# Patient Record
Sex: Male | Born: 1985 | Race: Black or African American | Hispanic: No | Marital: Single | Smoking: Current every day smoker
Health system: Southern US, Community
[De-identification: ages and names within clinical notes are randomized; demographics above are authoritative.]

## PROBLEM LIST (undated history)

## (undated) DIAGNOSIS — F419 Anxiety disorder, unspecified: Secondary | ICD-10-CM

## (undated) HISTORY — PX: APPENDECTOMY: SHX54

---
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
## 2008-03-03 ENCOUNTER — Emergency Department (HOSPITAL_COMMUNITY): Admission: EM | Admit: 2008-03-03 | Discharge: 2008-03-03 | Payer: Self-pay | Admitting: Emergency Medicine

## 2008-08-30 ENCOUNTER — Emergency Department (HOSPITAL_COMMUNITY): Admission: EM | Admit: 2008-08-30 | Discharge: 2008-08-30 | Payer: Self-pay | Admitting: Emergency Medicine

## 2008-09-23 ENCOUNTER — Emergency Department (HOSPITAL_COMMUNITY): Admission: EM | Admit: 2008-09-23 | Discharge: 2008-09-23 | Payer: Self-pay | Admitting: Emergency Medicine

## 2016-07-13 ENCOUNTER — Emergency Department (HOSPITAL_COMMUNITY)
Admission: EM | Admit: 2016-07-13 | Discharge: 2016-07-14 | Disposition: A | Discharge status: Home / Self Care | Payer: Self-pay | Attending: Emergency Medicine | Admitting: Emergency Medicine

## 2016-07-13 ENCOUNTER — Emergency Department (HOSPITAL_COMMUNITY): Payer: Self-pay

## 2016-07-13 ENCOUNTER — Encounter (HOSPITAL_COMMUNITY): Payer: Self-pay | Admitting: Emergency Medicine

## 2016-07-13 DIAGNOSIS — M62838 Other muscle spasm: Secondary | ICD-10-CM

## 2016-07-13 DIAGNOSIS — R0789 Other chest pain: Secondary | ICD-10-CM

## 2016-07-13 DIAGNOSIS — F17200 Nicotine dependence, unspecified, uncomplicated: Secondary | ICD-10-CM | POA: Insufficient documentation

## 2016-07-13 DIAGNOSIS — Z720 Tobacco use: Secondary | ICD-10-CM

## 2016-07-13 DIAGNOSIS — N179 Acute kidney failure, unspecified: Secondary | ICD-10-CM

## 2016-07-13 HISTORY — DX: Anxiety disorder, unspecified: F41.9

## 2016-07-13 LAB — CBC
HEMATOCRIT: 48.4 % (ref 39.0–52.0)
Hemoglobin: 16.6 g/dL (ref 13.0–17.0)
MCH: 31.1 pg (ref 26.0–34.0)
MCHC: 34.3 g/dL (ref 30.0–36.0)
MCV: 90.8 fL (ref 78.0–100.0)
PLATELETS: 235 10*3/uL (ref 150–400)
RBC: 5.33 MIL/uL (ref 4.22–5.81)
RDW: 12.4 % (ref 11.5–15.5)
WBC: 10.2 10*3/uL (ref 4.0–10.5)

## 2016-07-13 LAB — BASIC METABOLIC PANEL
Anion gap: 5 (ref 5–15)
BUN: 14 mg/dL (ref 6–20)
CHLORIDE: 107 mmol/L (ref 101–111)
CO2: 29 mmol/L (ref 22–32)
CREATININE: 1.37 mg/dL — AB (ref 0.61–1.24)
Calcium: 9.2 mg/dL (ref 8.9–10.3)
GFR calc non Af Amer: >60 mL/min (ref >60)
Glucose, Bld: 86 mg/dL (ref 65–99)
POTASSIUM: 3.5 mmol/L (ref 3.5–5.1)
Sodium: 141 mmol/L (ref 135–145)

## 2016-07-13 LAB — I-STAT TROPONIN, ED: Troponin i, poc: 0 ng/mL (ref 0.00–0.08)

## 2016-07-13 MED ORDER — NAPROXEN 500 MG PO TABS: 500.0000 mg | ORAL_TABLET | Freq: Two times a day (BID) | ORAL | 0 refills | Status: DC | PRN

## 2016-07-13 NOTE — ED Provider Notes (Signed)
[facility]-EMERGENCY DEPT Provider Note   CSN: [id number] Arrival date & time: 07/13/16  2117  By signing my name below, I, Rosario Adie, attest that this documentation has been prepared under the direction and in the presence of [address] [employer]. Electronically Signed: Rosario Adie, ED Scribe. 07/13/16. 11:49 PM.  History   Chief Complaint Chief Complaint  Patient presents with  . Chest Pain   The history is provided by the patient and medical records. No language interpreter was used.  Chest Pain   This is a new problem. The current episode started yesterday. The problem occurs constantly. The problem has not changed since onset.The pain is associated with movement and raising an arm. The pain is present in the lateral region (left). The pain is at a severity of 7/10. The pain is moderate. The quality of the pain is described as sharp. The pain radiates to the left shoulder. Duration of episode(s) is 1 day. Exacerbated by: movement of the left arm. Pertinent negatives include no abdominal pain, no claudication, no cough, no diaphoresis, no dizziness, no fever, no leg pain, no lower extremity edema, no nausea, no numbness, no orthopnea, no shortness of breath, no vomiting and no weakness. He has tried nothing for the symptoms. The treatment provided no relief. Risk factors include male gender and smoking/tobacco exposure.  Pertinent negatives for past medical history include no MI.  Pertinent negatives for family medical history include: no CAD, no early MI and no PE.    HPI Comments: Jacob Yang is a right-hand dominant 31 y.o. male with a PMHx of anxiety, who presents to the Emergency Department complaining of left-sided chest pain beginning 24 hours ago. Per pt, he was at rest driving during the onset of his pain around 10pm yesterday. He describes his pain as 7/10 constant sharp L lateral CP radiating to L scapular area, worse with movement and raising of the left arm, and  with no treatments tried PTA. Pt states that his pain "feels like a pinched nerve". Pt notes that has had been increasingly stressed in his personal life. He has a h/o anxiety, but is currently unmedicated for this. Pt does routinely life heavy objects in his career, works as a Academic librarian, and was recently doing a significant amount of heavy lifting. He denies FHx/PMHx of PE/DVT, recent long travel, surgery, or prolonged immobilization. Pt is a current, everyday smoker. No FHx of MI or CAD; however, his 63yo grandmother does have a pacemaker d/t "a hole in her heart". He denies fevers, chills, SOB, wheezing, cough, LE swelling, claudication, orthopnea, diaphoresis, light-headedness, abd pain, N/V/D/C, hematuria, dysuria, myalgias, arthralgias, numbness, tingling, focal weakness, or any other complaints at this time.  PCP: none  Past Medical History:  Diagnosis Date  . Anxiety    There are no active problems to display for this patient.  Past Surgical History:  Procedure Laterality Date  . APPENDECTOMY      Home Medications    Prior to Admission medications   Not on File   Family History No family history on file.  Social History Social History  Substance Use Topics  . Smoking status: Not on file  . Smokeless tobacco: Not on file  . Alcohol use Not on file   Allergies   Patient has no known allergies.  Review of Systems Review of Systems  Constitutional: Negative for chills, diaphoresis and fever.  Respiratory: Negative for cough, shortness of breath and wheezing.   Cardiovascular: Positive for chest pain.  Negative for orthopnea, claudication and leg swelling.  Gastrointestinal: Negative for abdominal pain, constipation, diarrhea, nausea and vomiting.  Genitourinary: Negative for dysuria and hematuria.  Musculoskeletal: Negative for arthralgias and myalgias.  Skin: Negative for color change.  Allergic/Immunologic: Negative for immunocompromised state.  Neurological:  Negative for dizziness, weakness, light-headedness and numbness.  Psychiatric/Behavioral: Negative for confusion.   A complete 10 system review of systems was obtained and all systems are negative except as noted in the HPI and PMH.   Physical Exam Updated Vital Signs BP 111/77 (BP Location: Left Arm)   Pulse 92   Temp 98.2 F (36.8 C) (Oral)   Resp 20   SpO2 99%   Physical Exam  Constitutional: He is oriented to person, place, and time. Vital signs are normal. He appears well-developed and well-nourished.  Non-toxic appearance. No distress.  Afebrile, nontoxic, NAD  HENT:  Head: Normocephalic and atraumatic.  Mouth/Throat: Oropharynx is clear and moist and mucous membranes are normal.  Eyes: Conjunctivae and EOM are normal. Right eye exhibits no discharge. Left eye exhibits no discharge.  Neck: Normal range of motion. Neck supple.  Cardiovascular: Normal rate, regular rhythm, normal heart sounds and intact distal pulses.  Exam reveals no gallop and no friction rub.   No murmur heard. RRR, nl s1/s2, no m/r/g, distal pulses intact, no pedal edema  Pulmonary/Chest: Effort normal and breath sounds normal. No respiratory distress. He has no decreased breath sounds. He has no wheezes. He has no rhonchi. He has no rales. He exhibits tenderness. He exhibits no crepitus, no deformity and no retraction.  CTAB in all lung fields, no w/r/r, no hypoxia or increased WOB, speaking in full sentences, SpO2 99% on RA Chest wall with mild left anterior TTP without crepitus, deformities, or retractions  Abdominal: Soft. Normal appearance and bowel sounds are normal. He exhibits no distension. There is no tenderness. There is no rigidity, no rebound, no guarding, no CVA tenderness, no tenderness at McBurney's point and negative Murphy's sign.  Musculoskeletal: Normal range of motion.  MAE x4 Strength and sensation grossly intact in all extremities Distal pulses intact Gait steady No pedal edema, neg  homan's bilaterally Left scapular area with mild muscular TTP and spasm, FROM intact   Neurological: He is alert and oriented to person, place, and time. He has normal strength. No sensory deficit.  Skin: Skin is warm, dry and intact. No rash noted.  Psychiatric: He has a normal mood and affect.  Nursing note and vitals reviewed.  ED Treatments / Results  DIAGNOSTIC STUDIES: Oxygen Saturation is 99% on RA, normal by my interpretation.   COORDINATION OF CARE: 11:48 PM-Discussed next steps with pt. Pt verbalized understanding and is agreeable with the plan.   Labs (all labs ordered are listed, but only abnormal results are displayed) Labs Reviewed  BASIC METABOLIC PANEL - Abnormal; Notable for the following:       Result Value   Creatinine, Ser 1.37 (*)    All other components within normal limits  CBC  I-STAT TROPOININ, ED   EKG  EKG Interpretation  Date/Time:  Tuesday July 13 2016 21:23:01 EST Ventricular Rate:  85 PR Interval:    QRS Duration: 76 QT Interval:  331 QTC Calculation: 394 R Axis:   16 Text Interpretation:  Sinus rhythm Normal ECG No old tracing to compare Confirmed by [facility]  MD, DAVID ([id number]) on 07/13/2016 9:31:52 PM      Radiology Dg Chest 2 View  Result Date: 07/13/2016 CLINICAL DATA:  Left chest pain, onset yesterday. Initial encounter. EXAM: CHEST  2 VIEW COMPARISON:  None. FINDINGS: The cardiomediastinal contours are normal. The lungs are clear. Pulmonary vasculature is normal. No consolidation, pleural effusion, or pneumothorax. No acute osseous abnormalities are seen. IMPRESSION: No acute pulmonary process. Electronically Signed   By: Rubye Oaks M.D.   On: 07/13/2016 22:07   Procedures Procedures   Medications Ordered in ED Medications - No data to display  Initial Impression / Assessment and Plan / ED Course  I have reviewed the triage vital signs and the nursing notes.  Pertinent labs & imaging results that were available during my  care of the patient were reviewed by me and considered in my medical decision making (see chart for details).     31 y.o. 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 Chest pain reproducible on exam, and mild L scapular muscular tenderness and spasm. No tachycardia or hypoxia, no LE swelling, PERC neg. Doubt other secondary etiologies for his complaints. Likely musculoskeletal. Labs unremarkable aside from Cr 1.37, which is likely from pt's recent alcohol consumption leading to mild dehydration; advised pt to stay hydrated with plenty of water. CXR neg, EKG unremarkable. +Smoker, smoking cessation advised. Given low risk HEART score and atypical chest wall pain, doubt need for further emergent work up. Advised use of tylenol/naprosyn/heat/massage, and smoking cessation. F/up with a PCP in 1-2wks for recheck and to establish medical care. I explained the diagnosis and have given explicit precautions to return to the ER including for any other new or worsening symptoms. The patient understands and accepts the medical plan as it's been dictated and I have answered their questions. Discharge instructions concerning home care and prescriptions have been given. The patient is STABLE and is discharged to home in good condition.   I personally performed the services described in this documentation, which was scribed in my presence. The recorded information has been reviewed and is accurate.   Final Clinical Impressions(s) / ED Diagnoses   Final diagnoses:  Atypical chest pain  Muscle spasm of left shoulder  AKI (acute kidney injury) (HCC)  Tobacco user   New Prescriptions New Prescriptions   NAPROXEN (NAPROSYN) 500 MG TABLET    Take 1 tablet (500 mg total) by mouth 2 (two) times daily as needed for mild pain, moderate pain or headache  (TAKE WITH MEALS.).     [address] PA-C 07/14/16 0004    Tomasita Crumble, MD 07/14/16 [phone]

## 2016-07-13 NOTE — ED Triage Notes (Signed)
Pt states that he has had chest pain since yesterday that is worse with movement and feels "like a pinched nerve." States he has a hx of anxiety and has also been working a lot lately leaving him exhausted. Alert and oriented.

## 2016-07-13 NOTE — Discharge Instructions (Signed)
Your labs, xray, and EKG are all reassuring, your chest pain could be a variety of things including gas pain, muscle pain, and other nonemergent issues. Alternate between tylenol and naprosyn as directed as needed for pain. Stay well hydrated. You may consider using heat to the areas of pain, no more than 20 minutes every hour. May also consider massaging the areas of soreness. STOP SMOKING! Establish care with a primary care doctor in the next 1-2 weeks for recheck of symptoms and to have medical care. Return to the ER for changes or worsening symptoms.  SEEK IMMEDIATE MEDICAL ATTENTION IF: You develop a fever.  Your chest pains become severe or intolerable.  You develop new, unexplained symptoms (problems).  You develop shortness of breath, nausea, vomiting, sweating or feel light headed.  You develop a new cough or you cough up blood. You develop new leg swelling

## 2018-01-11 ENCOUNTER — Emergency Department (HOSPITAL_COMMUNITY)
Admission: EM | Admit: 2018-01-11 | Discharge: 2018-01-11 | Disposition: A | Discharge status: Home / Self Care | Payer: Self-pay | Attending: Emergency Medicine | Admitting: Emergency Medicine

## 2018-01-11 ENCOUNTER — Emergency Department (HOSPITAL_COMMUNITY): Payer: Self-pay

## 2018-01-11 ENCOUNTER — Encounter (HOSPITAL_COMMUNITY): Payer: Self-pay

## 2018-01-11 ENCOUNTER — Other Ambulatory Visit: Payer: Self-pay

## 2018-01-11 DIAGNOSIS — W450XXA Nail entering through skin, initial encounter: Secondary | ICD-10-CM | POA: Insufficient documentation

## 2018-01-11 DIAGNOSIS — Y929 Unspecified place or not applicable: Secondary | ICD-10-CM | POA: Insufficient documentation

## 2018-01-11 DIAGNOSIS — F17210 Nicotine dependence, cigarettes, uncomplicated: Secondary | ICD-10-CM | POA: Insufficient documentation

## 2018-01-11 DIAGNOSIS — Y939 Activity, unspecified: Secondary | ICD-10-CM | POA: Insufficient documentation

## 2018-01-11 DIAGNOSIS — S91332A Puncture wound without foreign body, left foot, initial encounter: Secondary | ICD-10-CM | POA: Insufficient documentation

## 2018-01-11 DIAGNOSIS — Y999 Unspecified external cause status: Secondary | ICD-10-CM | POA: Insufficient documentation

## 2018-01-11 DIAGNOSIS — Z23 Encounter for immunization: Secondary | ICD-10-CM | POA: Insufficient documentation

## 2018-01-11 MED ORDER — AMOXICILLIN-POT CLAVULANATE 875-125 MG PO TABS: 1.0000 | ORAL_TABLET | Freq: Two times a day (BID) | ORAL | 0 refills | Status: DC

## 2018-01-11 MED ORDER — NAPROXEN 500 MG PO TABS: 500.0000 mg | ORAL_TABLET | Freq: Two times a day (BID) | ORAL | 0 refills | Status: DC

## 2018-01-11 MED ORDER — TETANUS-DIPHTH-ACELL PERTUSSIS 5-2.5-18.5 LF-MCG/0.5 IM SUSP
0.5000 mL | Freq: Once | INTRAMUSCULAR | Status: AC
  Administered 2018-01-11: 0.5 mL via INTRAMUSCULAR
  Filled 2018-01-11: qty 0.5

## 2018-01-11 MED ORDER — AMOXICILLIN-POT CLAVULANATE 875-125 MG PO TABS
1.0000 | ORAL_TABLET | Freq: Once | ORAL | Status: AC
  Administered 2018-01-11: 1 via ORAL
  Filled 2018-01-11: qty 1

## 2018-01-11 NOTE — ED Triage Notes (Signed)
Pt reports he stepped on a nail 2 weeks ago and has been treating it himself. Left foot pain.

## 2018-01-11 NOTE — ED Notes (Signed)
Pt called X 2 with no response 

## 2018-01-11 NOTE — Discharge Instructions (Addendum)
Soak your foot in warm water 1-2 times a day.  Be sure to wear socks and shoes and not walk barefooted.  Take the antibiotic as directed until it is finished.

## 2018-01-12 NOTE — ED Provider Notes (Signed)
[facility] EMERGENCY DEPARTMENT Provider Note   CSN: [id number] Arrival date & time: 01/11/18  1830     History   Chief Complaint Chief Complaint  Patient presents with  . Foot Pain    HPI Jacob Yang is a 32 y.o. male.  HPI   Jacob Yang is a 32 y.o. male who presents to the Emergency Department states that he stepped on a nail 2 weeks ago.  He has treated himself at home with cleaning the wound and applying first aid ointment.  He describes pain associated with weight bearing to his left foot and tenderness and redness to the ball of his foot.  He denies swelling, red streaking, fever and calf pain.  Last td is unknown, no hx of DM.   Past Medical History:  Diagnosis Date  . Anxiety     There are no active problems to display for this patient.   Past Surgical History:  Procedure Laterality Date  . APPENDECTOMY       Home Medications    Prior to Admission medications   Medication Sig Start Date End Date Taking? Authorizing Provider  amoxicillin-clavulanate (AUGMENTIN) 875-125 MG tablet Take 1 tablet by mouth every 12 (twelve) hours. 01/11/18   Floris Neuhaus, PA-C  naproxen (NAPROSYN) 500 MG tablet Take 1 tablet (500 mg total) by mouth 2 (two) times daily with a meal. 01/11/18   Aristidis Talerico, PA-C    Family History No family history on file.  Social History Social History   Tobacco Use  . Smoking status: Current Every Day Smoker    Packs/day: 0.50    Types: Cigarettes  Substance Use Topics  . Alcohol use: Yes  . Drug use: Never     Allergies   Patient has no known allergies.   Review of Systems Review of Systems  Constitutional: Negative for chills and fever.  Musculoskeletal: Positive for arthralgias (left foot pain). Negative for joint swelling.  Skin: Positive for color change and wound.       Puncture wound to left foot  Neurological: Negative for weakness and numbness.     Physical Exam Updated Vital Signs BP 121/75   Pulse 75    Temp 98.3 F (36.8 C) (Oral)   Resp 15   Ht 6' (1.829 m)   Wt 99.8 kg   SpO2 100%   BMI 29.84 kg/m   Physical Exam  Constitutional: He appears well-developed. No distress.  HENT:  Head: Atraumatic.  Cardiovascular: Normal rate, regular rhythm and intact distal pulses.  Pulmonary/Chest: Effort normal. No respiratory distress.  Musculoskeletal: He exhibits tenderness. He exhibits no edema.  Focal, mild erythema of the distal plantar surface of the left foot with small puncture wound of the fore foot.  No lymphangitis.    Neurological: He is alert. No sensory deficit.  Skin: Skin is warm. Capillary refill takes less than 2 seconds. There is erythema.  Nursing note and vitals reviewed.    ED Treatments / Results  Labs (all labs ordered are listed, but only abnormal results are displayed) Labs Reviewed - No data to display  EKG None  Radiology Dg Foot Complete Left  Result Date: 01/11/2018 CLINICAL DATA:  Initial evaluation for acute pain at plantar aspect of the third metatarsal head. Recently stepped on nail. EXAM: LEFT FOOT - COMPLETE 3+ VIEW COMPARISON:  None. FINDINGS: There is no evidence of fracture or dislocation. There is no evidence of arthropathy or other focal bone abnormality. Soft tissues are unremarkable. IMPRESSION: 1. No  acute osseous abnormality about the foot. 2. No radiopaque foreign body identified. Electronically Signed   By: Rise Mu M.D.   On: 01/11/2018 21:15    Procedures Procedures (including critical care time)  Medications Ordered in ED Medications  Tdap (BOOSTRIX) injection 0.5 mL (0.5 mLs Intramuscular Given 01/11/18 2047)  amoxicillin-clavulanate (AUGMENTIN) 875-125 MG per tablet 1 tablet (1 tablet Oral Given 01/11/18 2048)     Initial Impression / Assessment and Plan / ED Course  I have reviewed the triage vital signs and the nursing notes.  Pertinent labs & imaging results that were available during my care of the patient  were reviewed by me and considered in my medical decision making (see chart for details).     Pt with subacute puncture wound of left foot.  NV intact.  No osteomyelitis or abscess, no FB.  Pt agrees to wound care instructions, abx and close f/u if needed.    Final Clinical Impressions(s) / ED Diagnoses   Final diagnoses:  Puncture wound of left foot, initial encounter    ED Discharge Orders         Ordered    amoxicillin-clavulanate (AUGMENTIN) 875-125 MG tablet  Every 12 hours     01/11/18 2131    naproxen (NAPROSYN) 500 MG tablet  2 times daily with meals     01/11/18 2131           Pauline Aus, PA-C 01/12/18 2319    Samuel Jester, DO 01/13/18 1511

## 2019-12-18 IMAGING — DX DG FOOT COMPLETE 3+V*L*
3 series · 3 of 3 positions shown · non-contrast
Comparison: None.

CLINICAL DATA: Initial evaluation for acute pain at plantar aspect
of the third metatarsal head. Recently stepped on nail.

EXAM:
LEFT FOOT - COMPLETE 3+ VIEW

[foot ap]
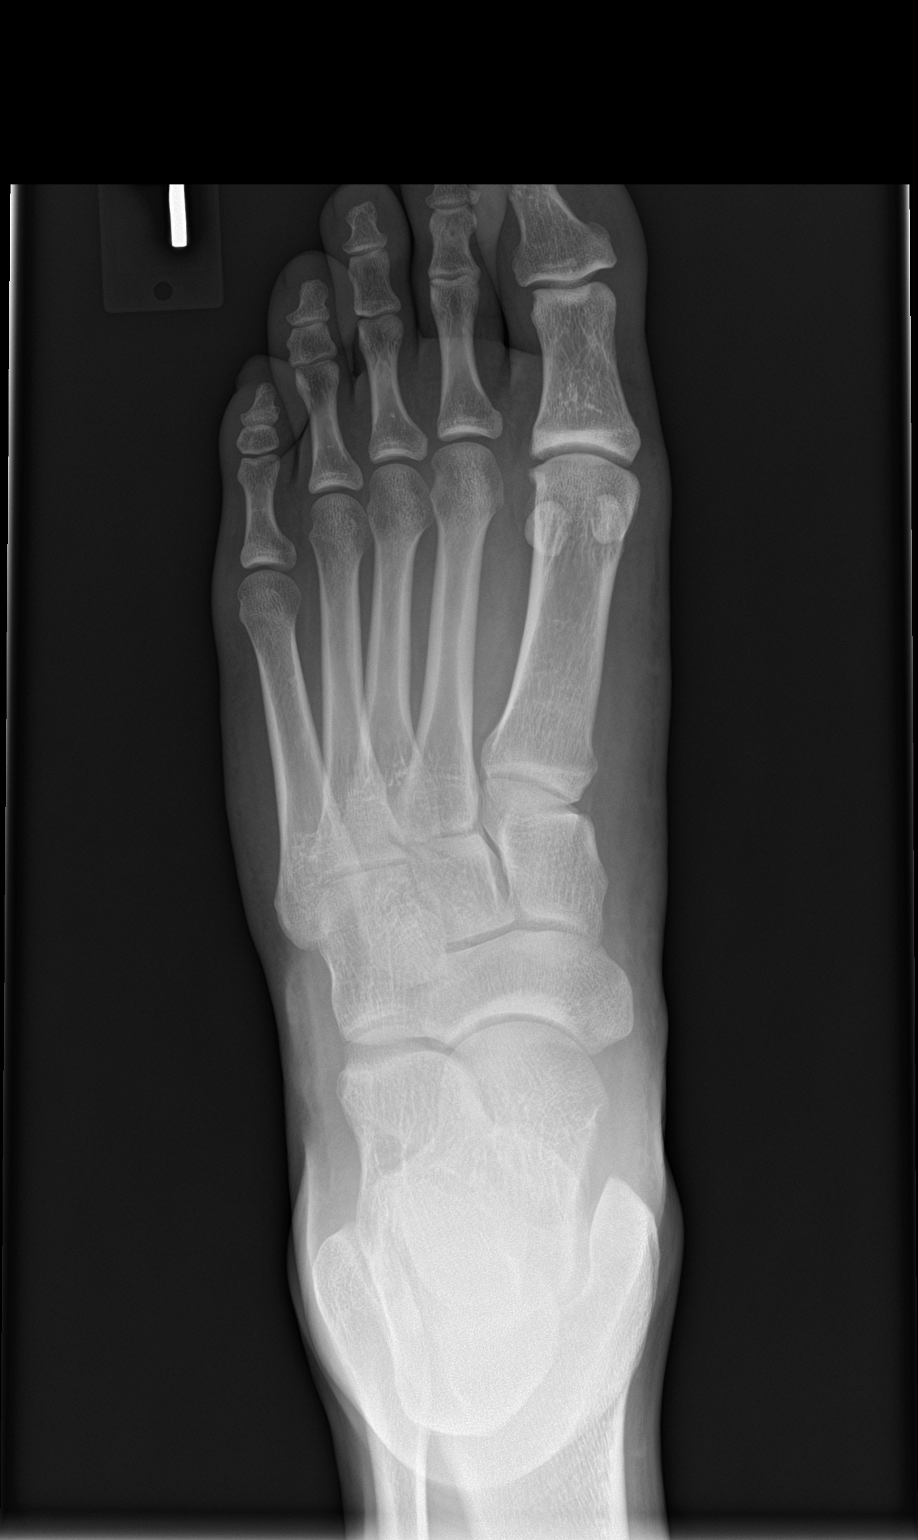

[foot obl]
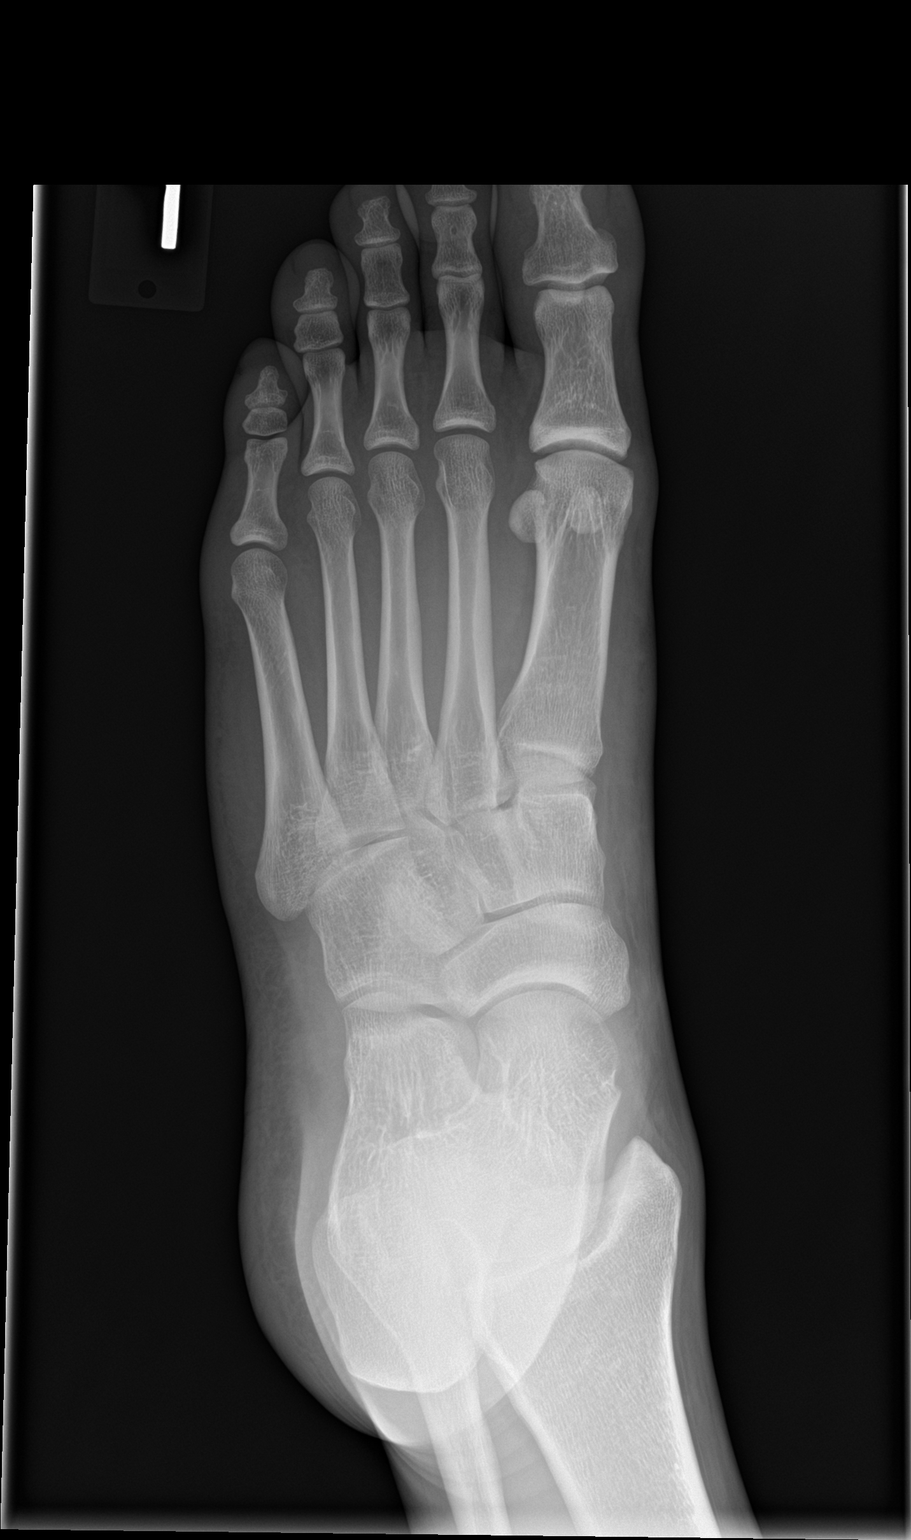

[foot lat]
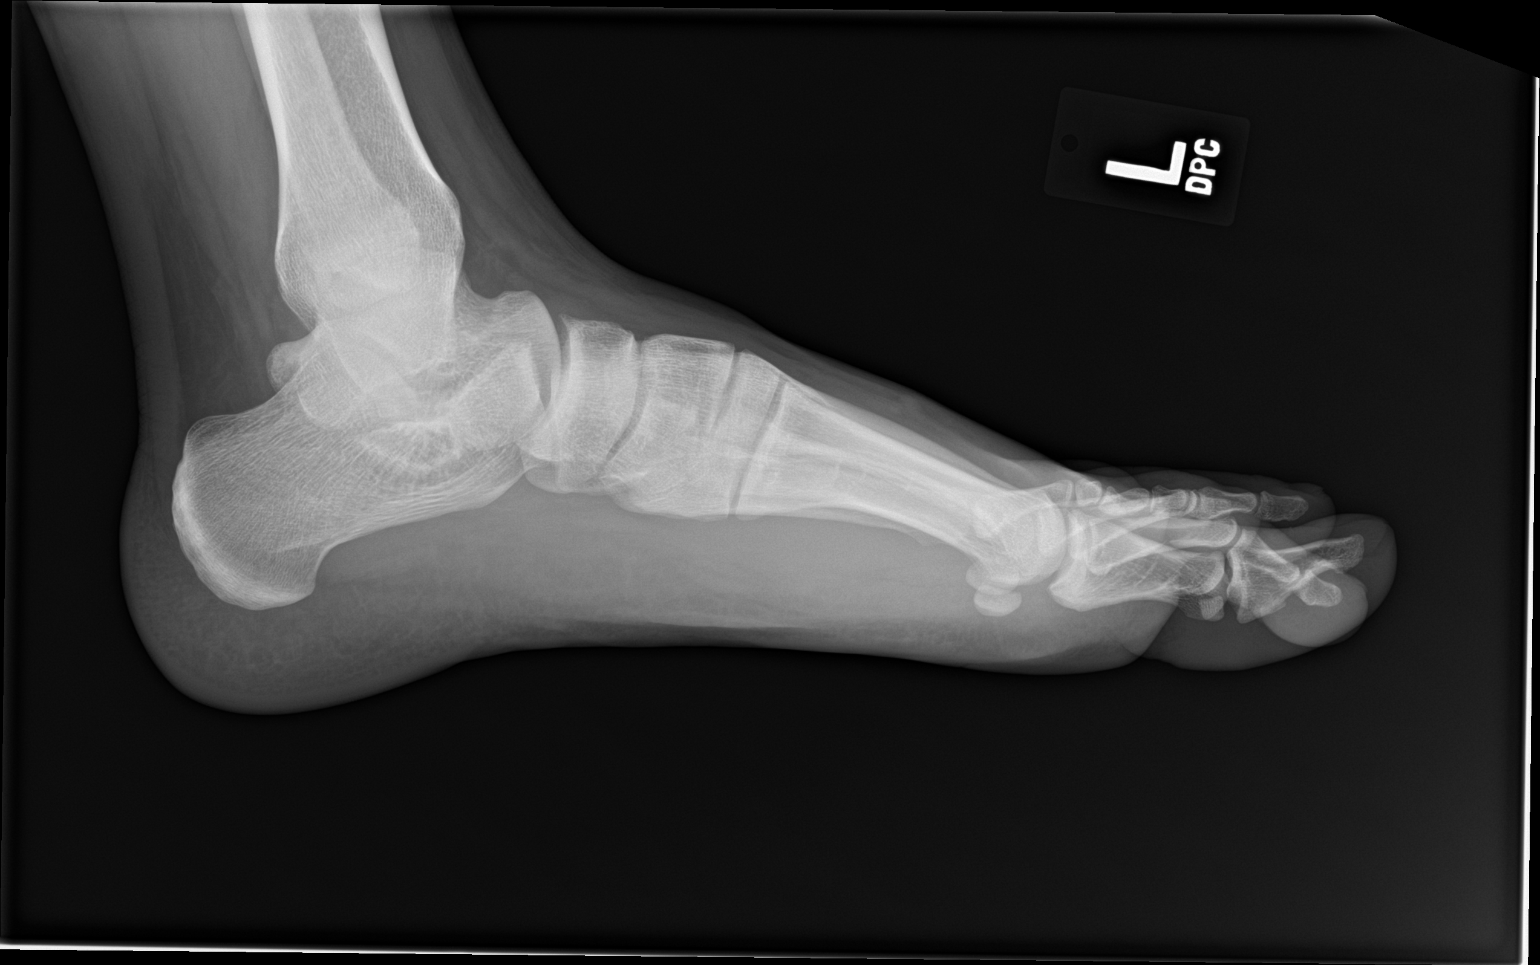

[3 of 3 positions shown; findings below may reference images not displayed]

FINDINGS: There is no evidence of fracture or dislocation. There is no
evidence of arthropathy or other focal bone abnormality. Soft
tissues are unremarkable.
IMPRESSION: 1. No acute osseous abnormality about the foot.
2. No radiopaque foreign body identified.

## 2022-10-12 ENCOUNTER — Emergency Department (HOSPITAL_COMMUNITY): Payer: Self-pay

## 2022-10-12 ENCOUNTER — Emergency Department (HOSPITAL_COMMUNITY)
Admission: EM | Admit: 2022-10-12 | Discharge: 2022-10-12 | Disposition: A | Discharge status: Home / Self Care | Payer: Self-pay | Attending: Emergency Medicine | Admitting: Emergency Medicine

## 2022-10-12 ENCOUNTER — Other Ambulatory Visit: Payer: Self-pay

## 2022-10-12 ENCOUNTER — Encounter (HOSPITAL_COMMUNITY): Payer: Self-pay | Admitting: Emergency Medicine

## 2022-10-12 DIAGNOSIS — T7840XA Allergy, unspecified, initial encounter: Secondary | ICD-10-CM | POA: Insufficient documentation

## 2022-10-12 LAB — CBG MONITORING, ED: Glucose-Capillary: 101 mg/dL — ABNORMAL HIGH (ref 70–99)

## 2022-10-12 LAB — CBC
HCT: 45.9 % (ref 39.0–52.0)
Hemoglobin: 15.5 g/dL (ref 13.0–17.0)
MCH: 31.1 pg (ref 26.0–34.0)
MCHC: 33.8 g/dL (ref 30.0–36.0)
MCV: 92.2 fL (ref 80.0–100.0)
Platelets: 251 10*3/uL (ref 150–400)
RBC: 4.98 MIL/uL (ref 4.22–5.81)
RDW: 13.2 % (ref 11.5–15.5)
WBC: 12.8 10*3/uL — ABNORMAL HIGH (ref 4.0–10.5)
nRBC: 0 % (ref 0.0–0.2)

## 2022-10-12 LAB — BASIC METABOLIC PANEL
Anion gap: 14 (ref 5–15)
BUN: 17 mg/dL (ref 6–20)
CO2: 22 mmol/L (ref 22–32)
Calcium: 9.3 mg/dL (ref 8.9–10.3)
Chloride: 100 mmol/L (ref 98–111)
Creatinine, Ser: 1.1 mg/dL (ref 0.61–1.24)
GFR, Estimated: >60 mL/min (ref >60)
Glucose, Bld: 92 mg/dL (ref 70–99)
Potassium: 3.7 mmol/L (ref 3.5–5.1)
Sodium: 136 mmol/L (ref 135–145)

## 2022-10-12 LAB — MAGNESIUM: Magnesium: 2.3 mg/dL (ref 1.7–2.4)

## 2022-10-12 MED ORDER — DIPHENHYDRAMINE HCL 25 MG PO CAPS
25.0000 mg | ORAL_CAPSULE | Freq: Once | ORAL | Status: AC
  Administered 2022-10-12: 25 mg via ORAL
  Filled 2022-10-12: qty 1

## 2022-10-12 MED ORDER — FAMOTIDINE 20 MG PO TABS: 20.0000 mg | ORAL_TABLET | Freq: Two times a day (BID) | ORAL | 0 refills | Status: AC

## 2022-10-12 MED ORDER — PREDNISONE 50 MG PO TABS
60.0000 mg | ORAL_TABLET | Freq: Once | ORAL | Status: AC
  Administered 2022-10-12: 60 mg via ORAL
  Filled 2022-10-12: qty 1

## 2022-10-12 MED ORDER — FAMOTIDINE 20 MG PO TABS
20.0000 mg | ORAL_TABLET | Freq: Once | ORAL | Status: AC
  Administered 2022-10-12: 20 mg via ORAL
  Filled 2022-10-12: qty 1

## 2022-10-12 MED ORDER — PREDNISONE 10 MG PO TABS: 40.0000 mg | ORAL_TABLET | Freq: Every day | ORAL | 0 refills | Status: AC

## 2022-10-12 MED ORDER — DIPHENHYDRAMINE HCL 25 MG PO TABS: 25.0000 mg | ORAL_TABLET | Freq: Four times a day (QID) | ORAL | 0 refills | Status: AC | PRN

## 2022-10-12 MED ORDER — ERYTHROMYCIN 5 MG/GM OP OINT: TOPICAL_OINTMENT | OPHTHALMIC | 0 refills | Status: AC

## 2022-10-12 NOTE — ED Triage Notes (Addendum)
Pt states thinks he is having an allergic reaction due to lips swelling. Pt states he had blacked out for about 2 seconds but did not lose consciousness.  Pt a/o/anxious. Trying to start sentences but then stops and can't finish what he is saying. Lips swelling to both but more swelling to left side of upper and lower lips noted. No oral swelling noted. Pt c/o pain to TMJ area. Pt a/o. Color wnl. Mm moist.  SS started around 8am. Was given benadryl at that time. 25mg  Pt just admitted to using cocaine this am prior to symptoms starting.

## 2022-10-12 NOTE — ED Provider Notes (Signed)
[place] EMERGENCY DEPARTMENT AT [facility] Provider Note   CSN: [id number] Arrival date & time: 10/12/22  4098     History  Chief Complaint  Patient presents with   Facial Swelling   Loss of Consciousness    Jacob Yang is a 37 y.o. male.   Loss of Consciousness Patient presents for facial swelling.  He has no known chronic medical problems.  He states that he will get seasonal allergies at times but denies any other history of allergic reactions.  Patient reports a small cut on the inside of his left lower lip from chewing on it.  This morning, he placed a popsicle to the area.  Shortly thereafter, he developed swelling of both lips.  He did take a Benadryl, that was provided by a friend.  He had what he describes as a brief blacking out that lasted for 1 to 2 seconds.  During this episode, he denies the sensation of any other areas of swelling, chest tightness, nausea.  Currently, he feels that the swelling in his lips has improved.  He does not take any daily medications.  He did take a dose of amoxicillin yesterday for self treatment of some recent irritation of his right eye.  He describes right eye irritation as mucopurulent drainage with mild pain.  The symptoms have resolved over the past couple days.  He currently denies any eye discomfort.     Home Medications Prior to Admission medications   Medication Sig Start Date End Date Taking? Authorizing Provider  diphenhydrAMINE (BENADRYL) 25 MG tablet Take 1 tablet (25 mg total) by mouth every 6 (six) hours as needed for up to 3 days. 10/12/22 10/15/22 Yes Gloris Manchester, MD  erythromycin ophthalmic ointment Place a 1/2 inch ribbon of ointment into the lower eyelid.  You can do this up to 4 times per day until symptoms resolve. 10/12/22  Yes Gloris Manchester, MD  famotidine (PEPCID) 20 MG tablet Take 1 tablet (20 mg total) by mouth 2 (two) times daily for 3 days. 10/12/22 10/15/22 Yes Gloris Manchester, MD  predniSONE (DELTASONE) 10 MG  tablet Take 4 tablets (40 mg total) by mouth daily for 3 days. 10/12/22 10/15/22 Yes Gloris Manchester, MD      Allergies    Patient has no known allergies.    Review of Systems   Review of Systems  HENT:  Positive for facial swelling.   Eyes:  Positive for redness.  Cardiovascular:  Positive for syncope.  Neurological:  Positive for syncope.  All other systems reviewed and are negative.   Physical Exam Updated Vital Signs BP (!) 142/88 (BP Location: Left Arm)   Pulse 96   Temp 98.2 F (36.8 C) (Oral)   Resp 18   SpO2 100%  Physical Exam Vitals and nursing note reviewed.  Constitutional:      General: He is not in acute distress.    Appearance: Normal appearance. He is well-developed. He is not ill-appearing, toxic-appearing or diaphoretic.  HENT:     Head: Normocephalic and atraumatic.     Right Ear: External ear normal.     Left Ear: External ear normal.     Nose: Nose normal.     Mouth/Throat:     Mouth: Mucous membranes are moist.     Comments: Mild swelling to lips Eyes:     Extraocular Movements: Extraocular movements intact.     Conjunctiva/sclera: Conjunctivae normal.  Cardiovascular:     Rate and Rhythm: Normal rate and regular  rhythm.     Heart sounds: No murmur heard. Pulmonary:     Effort: Pulmonary effort is normal. No respiratory distress.     Breath sounds: Normal breath sounds. No wheezing or rales.  Abdominal:     General: There is no distension.     Palpations: Abdomen is soft.     Tenderness: There is no abdominal tenderness.  Musculoskeletal:        General: No swelling. Normal range of motion.     Cervical back: Normal range of motion and neck supple.     Right lower leg: No edema.     Left lower leg: No edema.  Skin:    General: Skin is warm and dry.     Coloration: Skin is not jaundiced or pale.  Neurological:     General: No focal deficit present.     Mental Status: He is alert and oriented to person, place, and time.  Psychiatric:         Mood and Affect: Mood normal.        Behavior: Behavior normal.     ED Results / Procedures / Treatments   Labs (all labs ordered are listed, but only abnormal results are displayed) Labs Reviewed  CBC - Abnormal; Notable for the following components:      Result Value   WBC 12.8 (*)    All other components within normal limits  CBG MONITORING, ED - Abnormal; Notable for the following components:   Glucose-Capillary 101 (*)    All other components within normal limits  BASIC METABOLIC PANEL  URINALYSIS, ROUTINE W REFLEX MICROSCOPIC  MAGNESIUM    EKG None  Radiology No results found.  Procedures Procedures    Medications Ordered in ED Medications  predniSONE (DELTASONE) tablet 60 mg (60 mg Oral Given 10/12/22 1144)  famotidine (PEPCID) tablet 20 mg (20 mg Oral Given 10/12/22 1145)  diphenhydrAMINE (BENADRYL) capsule 25 mg (25 mg Oral Given 10/12/22 1145)    ED Course/ Medical Decision Making/ A&P                             Medical Decision Making Amount and/or Complexity of Data Reviewed Labs: ordered. Radiology: ordered.  Risk OTC drugs. Prescription drug management.   Patient presents for concern of allergic reaction.  Swelling seem to only involve his lips.  He describes a brief episode of blacking out for 1 to 2 seconds.  On arrival in the ED, patient is well-appearing.  He does appear to have some mild swelling to his lips, primarily in the left lower area where he does endorse recent injury from biting on his lip.  He is not on any home medications.  He has never had an allergic reaction like this in the past.  On exam, there does not appear to be any tongue swelling.  Oropharynx is widely patent.  Lungs are clear to auscultation.  Patient was given prednisone, Benadryl, and Pepcid.  His right eye has some mild conjunctival injection.  He does not report some recent mucopurulent discharge from the side.  Patient was prescribed erythromycin ointment, in addition  to several more days of H1 blocker, H2 blocker, and steroid.  He was offered ED observation to assess for continued improvement in symptoms.  He declined stating that he would like to go.  Given that he has had ongoing improvement since onset of symptoms this morning, I feel this is reasonable.  He was  discharged in good condition.        Final Clinical Impression(s) / ED Diagnoses Final diagnoses:  Allergic reaction, initial encounter    Rx / DC Orders ED Discharge Orders          Ordered    predniSONE (DELTASONE) 10 MG tablet  Daily        10/12/22 1146    famotidine (PEPCID) 20 MG tablet  2 times daily        10/12/22 1146    diphenhydrAMINE (BENADRYL) 25 MG tablet  Every 6 hours PRN        10/12/22 1146    erythromycin ophthalmic ointment        10/12/22 1146              Gloris Manchester, MD 10/12/22 1156

## 2022-10-12 NOTE — Discharge Instructions (Signed)
You were prescribed ongoing treatment for an allergic reaction.  Take prednisone, Pepcid, and Benadryl as prescribed for the next 3 days, starting tomorrow.    Erythromycin ointment is for treatment of pinkeye.  Place ointment onto lower eyelid 4 times per day until eye discomfort is resolved.    Return to the emergency department for any new or worsening symptoms of concern.
